# Patient Record
Sex: Female | Born: 1945 | Hispanic: Yes | Marital: Married | Smoking: Never smoker
Health system: Southern US, Community
[De-identification: ages and names within clinical notes are randomized; demographics above are authoritative.]

## PROBLEM LIST (undated history)

## (undated) DIAGNOSIS — M2550 Pain in unspecified joint: Secondary | ICD-10-CM

## (undated) DIAGNOSIS — I10 Essential (primary) hypertension: Secondary | ICD-10-CM

## (undated) DIAGNOSIS — E785 Hyperlipidemia, unspecified: Secondary | ICD-10-CM

## (undated) DIAGNOSIS — E559 Vitamin D deficiency, unspecified: Secondary | ICD-10-CM

## (undated) DIAGNOSIS — E119 Type 2 diabetes mellitus without complications: Secondary | ICD-10-CM

## (undated) DIAGNOSIS — R5383 Other fatigue: Secondary | ICD-10-CM

## (undated) HISTORY — DX: Pain in unspecified joint: M25.50

## (undated) HISTORY — DX: Hyperlipidemia, unspecified: E78.5

## (undated) HISTORY — DX: Vitamin D deficiency, unspecified: E55.9

## (undated) HISTORY — DX: Essential (primary) hypertension: I10

## (undated) HISTORY — DX: Other fatigue: R53.83

## (undated) HISTORY — DX: Type 2 diabetes mellitus without complications: E11.9

---

## 2004-02-28 ENCOUNTER — Other Ambulatory Visit: Payer: Self-pay

## 2004-04-02 ENCOUNTER — Other Ambulatory Visit: Payer: Self-pay

## 2004-10-30 ENCOUNTER — Ambulatory Visit: Payer: Self-pay | Admitting: Oncology

## 2004-12-01 ENCOUNTER — Ambulatory Visit: Payer: Self-pay | Admitting: Oncology

## 2004-12-12 ENCOUNTER — Ambulatory Visit: Payer: Self-pay | Admitting: Oncology

## 2005-03-04 ENCOUNTER — Ambulatory Visit: Payer: Self-pay | Admitting: Oncology

## 2005-03-09 ENCOUNTER — Ambulatory Visit: Payer: Self-pay | Admitting: Oncology

## 2005-03-23 ENCOUNTER — Ambulatory Visit: Payer: Self-pay | Admitting: Oncology

## 2005-07-06 ENCOUNTER — Ambulatory Visit: Payer: Self-pay | Admitting: Oncology

## 2005-08-05 ENCOUNTER — Ambulatory Visit: Payer: Self-pay | Admitting: Oncology

## 2005-08-06 ENCOUNTER — Ambulatory Visit: Payer: Self-pay | Admitting: Oncology

## 2005-10-28 ENCOUNTER — Ambulatory Visit: Payer: Self-pay | Admitting: Family Medicine

## 2005-11-23 ENCOUNTER — Ambulatory Visit: Payer: Self-pay | Admitting: Family Medicine

## 2005-12-24 ENCOUNTER — Ambulatory Visit: Payer: Self-pay | Admitting: Family Medicine

## 2006-01-21 ENCOUNTER — Ambulatory Visit: Payer: Self-pay | Admitting: Family Medicine

## 2006-02-21 ENCOUNTER — Ambulatory Visit: Payer: Self-pay | Admitting: Family Medicine

## 2006-03-11 ENCOUNTER — Ambulatory Visit: Payer: Self-pay | Admitting: Oncology

## 2006-03-18 ENCOUNTER — Ambulatory Visit: Payer: Self-pay | Admitting: Oncology

## 2006-03-23 ENCOUNTER — Ambulatory Visit: Payer: Self-pay | Admitting: Family Medicine

## 2006-03-23 ENCOUNTER — Ambulatory Visit: Payer: Self-pay | Admitting: Oncology

## 2006-09-16 ENCOUNTER — Ambulatory Visit: Payer: Self-pay | Admitting: Oncology

## 2006-10-05 ENCOUNTER — Ambulatory Visit: Payer: Self-pay

## 2007-02-22 ENCOUNTER — Ambulatory Visit: Payer: Self-pay | Admitting: Oncology

## 2007-03-18 ENCOUNTER — Ambulatory Visit: Payer: Self-pay | Admitting: Oncology

## 2007-04-07 ENCOUNTER — Ambulatory Visit: Payer: Self-pay | Admitting: Oncology

## 2007-04-24 ENCOUNTER — Ambulatory Visit: Payer: Self-pay | Admitting: Oncology

## 2007-05-01 ENCOUNTER — Emergency Department: Payer: Self-pay | Admitting: Emergency Medicine

## 2007-05-24 IMAGING — CT CT ABD-PELV W/ CM
1 of 2 series · 16 of 32 positions shown, 20 images · non-contrast
Comparison: none

REASON FOR EXAM: Uterine cancer f/u.  PR notified for interpreter
COMMENTS:

PROCEDURE:     CT  - CT ABDOMEN / PELVIS  W  - March 11, 2006  [DATE]
RESULT:
REASON FOR CONSULTATION:  Uterine cancer followup.
TECHNIQUE: Axial images were obtained from the hemidiaphragms through the
pubic symphysis post intravenous and oral administration of contrast.

[Series 2: soft tissue · axial · 0.69mm/px · z∈[+538,+946]mm · 16 of 57 slices shown, 20 images]
[im 3/57  soft-tissue]
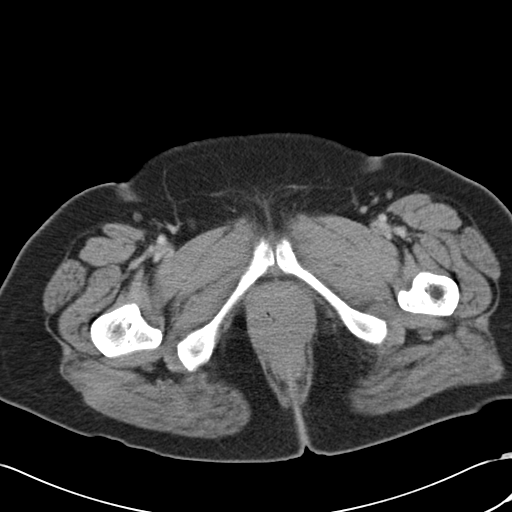
[im 3/57  bone]
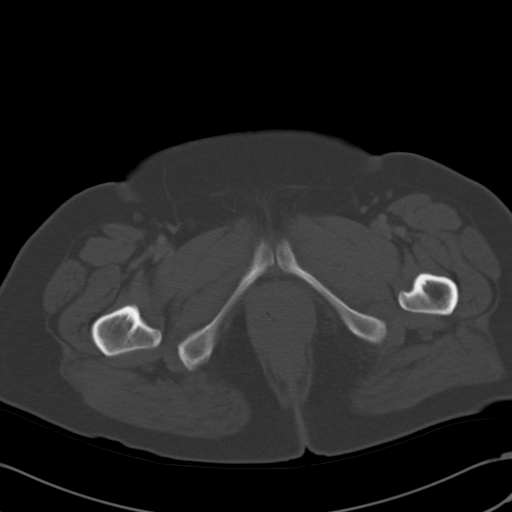
[im 7/57  soft-tissue]
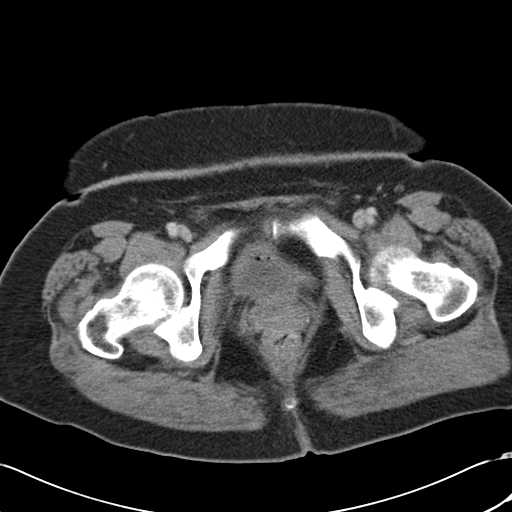
[im 12/57  soft-tissue]
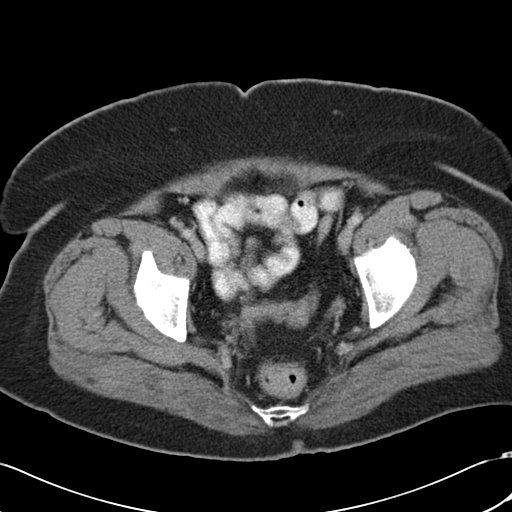
[im 16/57  soft-tissue]
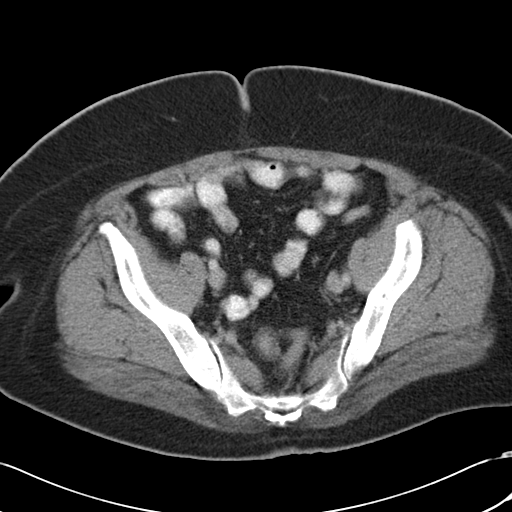
[im 18/57  soft-tissue]
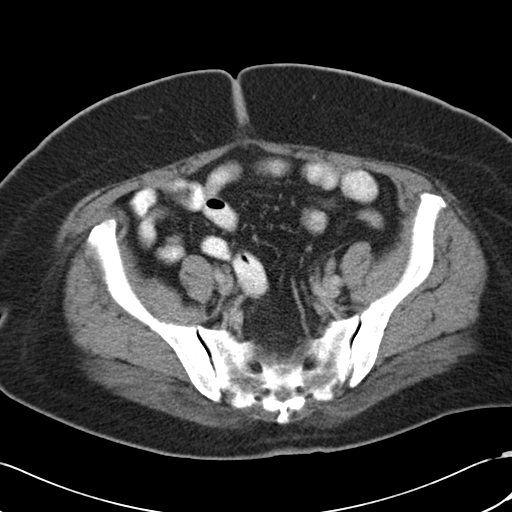
[im 23/57  soft-tissue]
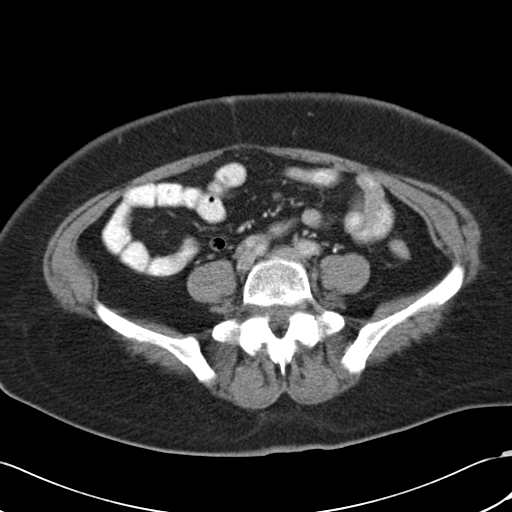
[im 27/57  soft-tissue]
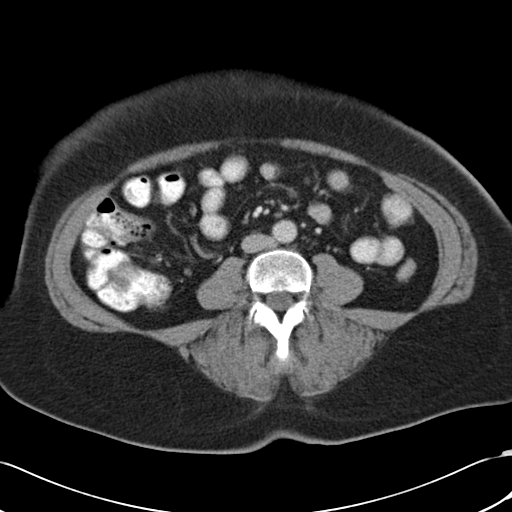
[im 30/57  soft-tissue]
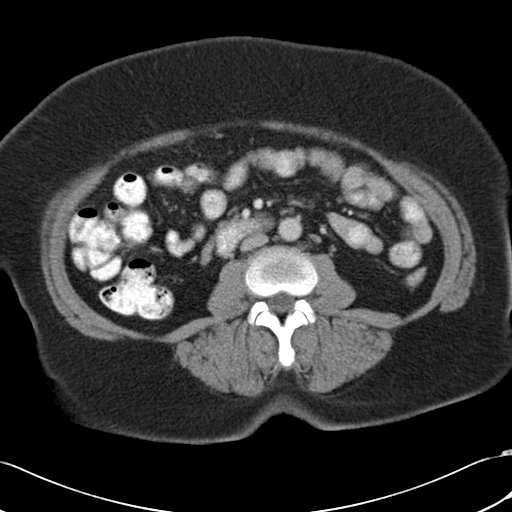
[im 34/57  soft-tissue]
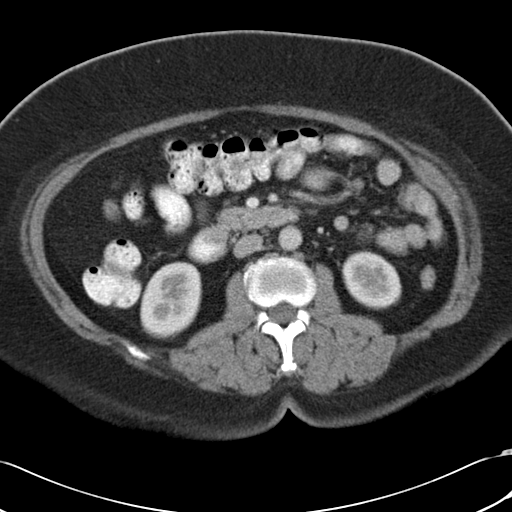
[im 34/57  bone]
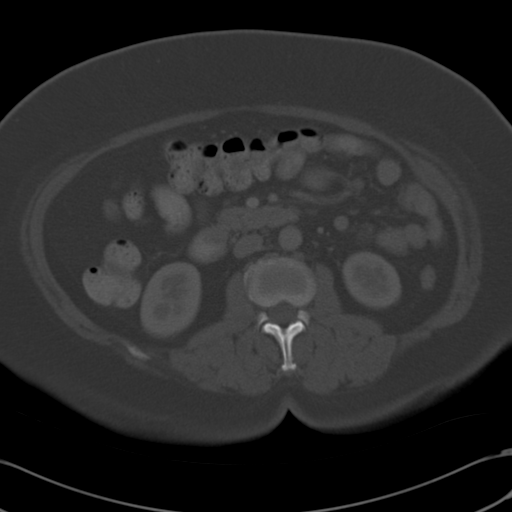
[im 39/57  soft-tissue]
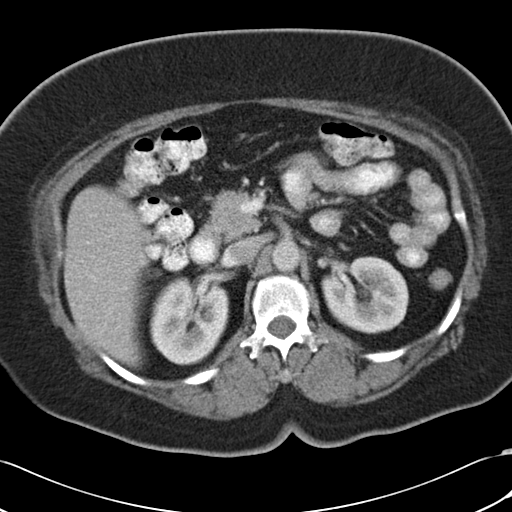
[im 43/57  soft-tissue]
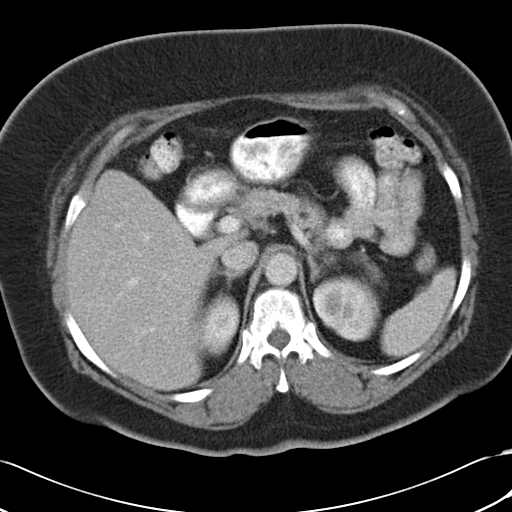
[im 45/57  soft-tissue]
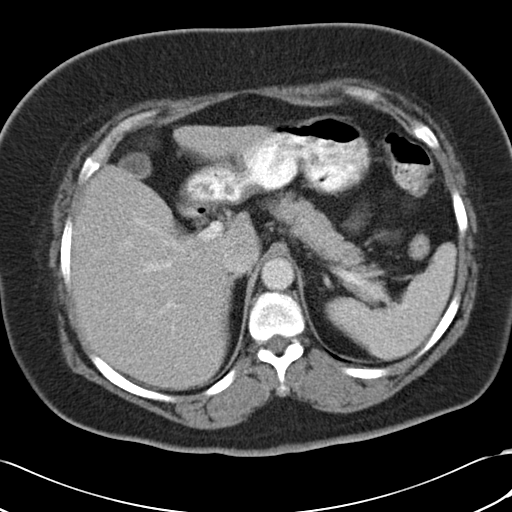
[im 48/57  lung]
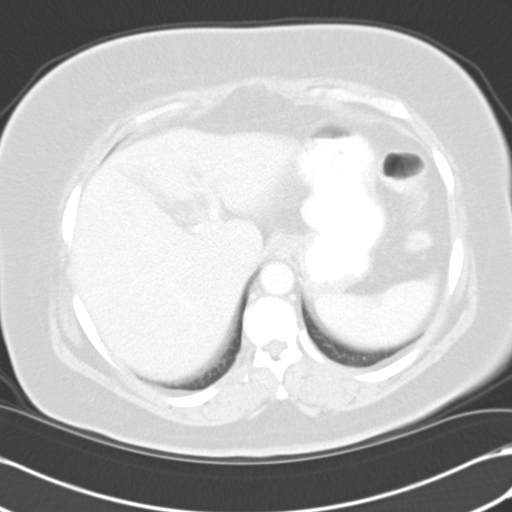
[im 50/57  soft-tissue]
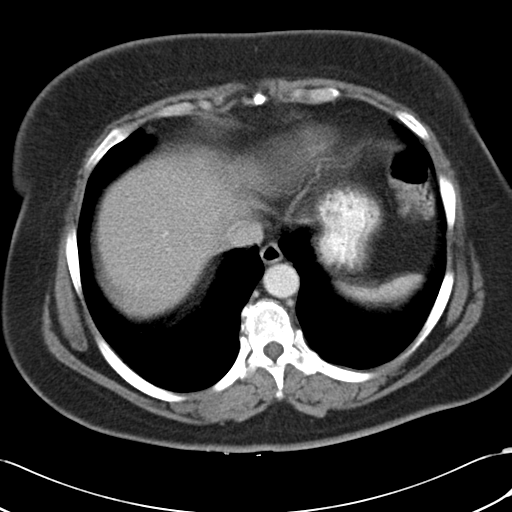
[im 50/57  lung]
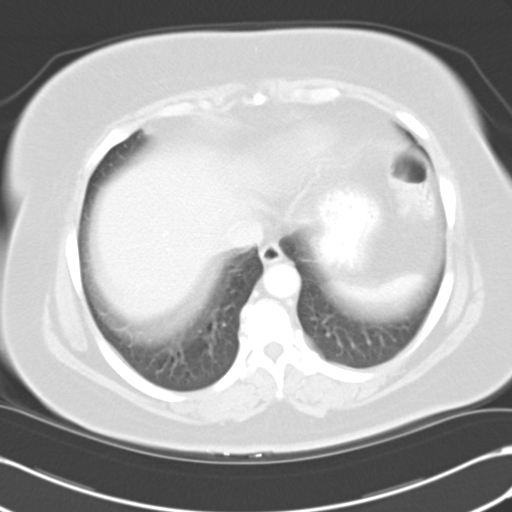
[im 52/57  lung]
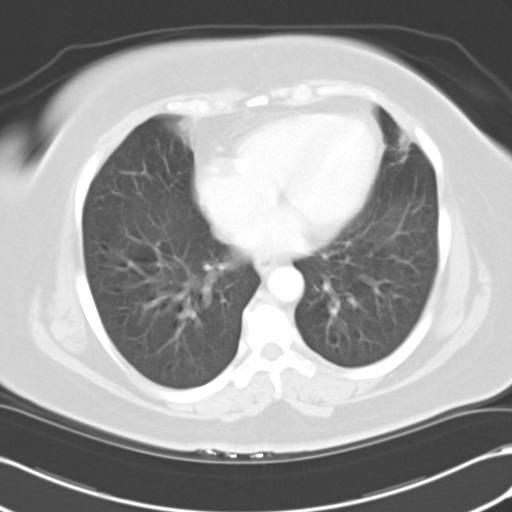
[im 54/57  soft-tissue]
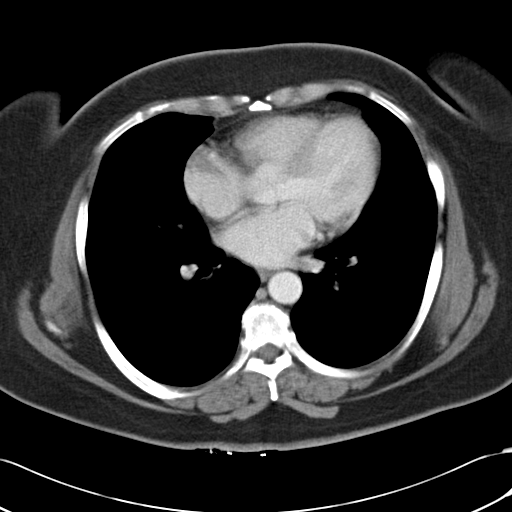
[im 54/57  lung]
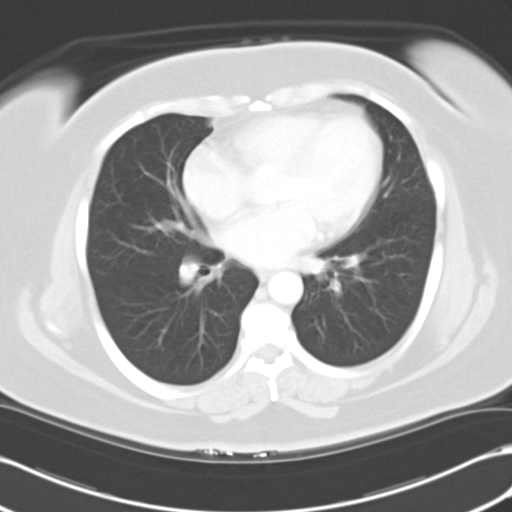

[16 of 32 positions shown; findings below may reference images not displayed]

The liver and spleen appear intact.  No focal mass is seen.  The liver and
spleen appear intact as well as the pancreas.  Both kidneys excrete the
contrast.  No hydronephrosis.  The adrenal glands appear intact.

No retroperitoneal adenopathy is identified.  No pelvic adenopathy is seen.
No fluid is noted in the pelvis or abdomen.

The images through the lung bases reveal the lung bases to be clear with no
effusions.
IMPRESSION: No evidence of metastasis is identified on the abdominal/pelvic CT.

## 2007-10-24 ENCOUNTER — Ambulatory Visit: Payer: Self-pay | Admitting: Oncology

## 2007-11-03 ENCOUNTER — Ambulatory Visit: Payer: Self-pay | Admitting: Oncology

## 2007-11-24 ENCOUNTER — Ambulatory Visit: Payer: Self-pay | Admitting: Oncology

## 2008-01-03 ENCOUNTER — Ambulatory Visit: Payer: Self-pay

## 2008-02-18 ENCOUNTER — Ambulatory Visit: Payer: Self-pay | Admitting: Family Medicine

## 2008-05-10 ENCOUNTER — Ambulatory Visit: Payer: Self-pay | Admitting: Oncology

## 2008-10-23 ENCOUNTER — Ambulatory Visit: Payer: Self-pay | Admitting: Oncology

## 2008-11-14 ENCOUNTER — Ambulatory Visit: Payer: Self-pay | Admitting: Oncology

## 2008-11-23 ENCOUNTER — Ambulatory Visit: Payer: Self-pay | Admitting: Oncology

## 2008-12-24 ENCOUNTER — Ambulatory Visit: Payer: Self-pay | Admitting: Oncology

## 2009-04-30 ENCOUNTER — Ambulatory Visit: Payer: Self-pay

## 2010-01-27 IMAGING — CR DG ABDOMEN 1V
1 series · 1 of 1 positions shown · non-contrast
Comparison: none

REASON FOR EXAM: lt flank  pain
COMMENTS:

[view not recorded]
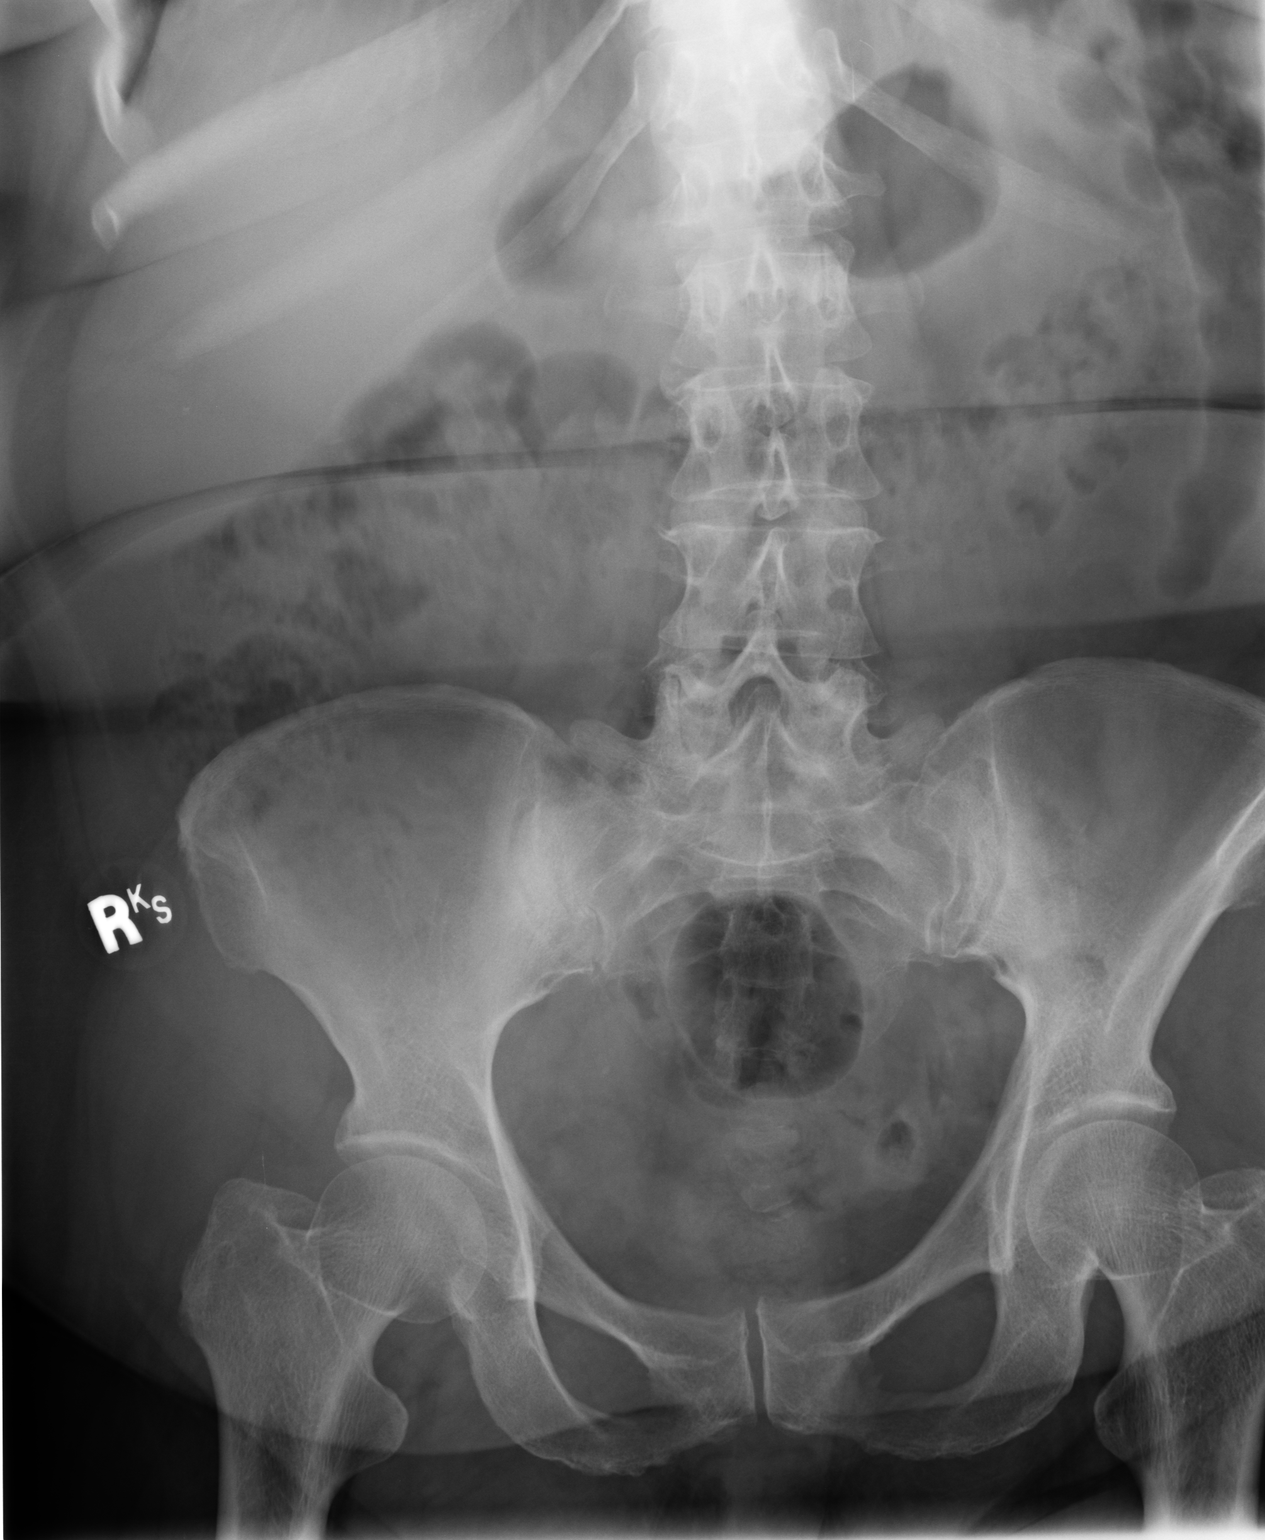

[1 of 1 positions shown; findings below may reference images not displayed]

PROCEDURE:     DXR - DXR KIDNEY URETER BLADDER  - November 14, 2008  [DATE]

RESULT:     Images of the abdomen demonstrate an unremarkable bowel gas
pattern. There is motion artifact. No definite urinary tract calculi are
evident. Degenerative changes are noted in the lower lumbar spine and
sacroiliac joints.
IMPRESSION: The bowel gas pattern is unremarkable. There are no
definite urinary tract stones evident.

## 2010-05-23 ENCOUNTER — Ambulatory Visit: Payer: Self-pay | Admitting: Oncology

## 2010-06-18 ENCOUNTER — Ambulatory Visit: Payer: Self-pay | Admitting: Oncology

## 2010-06-20 LAB — CA 125: CA 125: 14.7 U/mL (ref 0.0–34.0)

## 2010-06-23 ENCOUNTER — Ambulatory Visit: Payer: Self-pay | Admitting: Oncology

## 2010-07-29 ENCOUNTER — Ambulatory Visit: Payer: Self-pay | Admitting: Oncology

## 2010-07-29 ENCOUNTER — Ambulatory Visit: Payer: Self-pay | Admitting: Gynecologic Oncology

## 2010-09-10 ENCOUNTER — Ambulatory Visit: Payer: Self-pay

## 2011-12-08 ENCOUNTER — Ambulatory Visit: Payer: Self-pay | Admitting: Oncology

## 2011-12-11 LAB — PATHOLOGY REPORT

## 2011-12-25 ENCOUNTER — Ambulatory Visit: Payer: Self-pay | Admitting: Oncology

## 2013-08-09 ENCOUNTER — Ambulatory Visit: Payer: Self-pay | Admitting: Internal Medicine

## 2014-12-11 ENCOUNTER — Ambulatory Visit: Payer: Self-pay | Admitting: Primary Care

## 2015-05-30 ENCOUNTER — Telehealth: Payer: Self-pay | Admitting: *Deleted

## 2015-05-30 NOTE — Telephone Encounter (Signed)
Dr. Lawerance BachBurns contacted Dr. Doylene Canninghoksi today to discuss the treatment plan for pap smear follow-up and pelvic exams.  reviewed Dr. Jackson LatinoBrigitte Miller's plan of care.  In 2013,  The patient's pap smear demonstrated only minor changes. Per Dr. Rondel BatonMiller's notes, the patient's biopsies were normal and the HPV tests were negative.  Dr. Hyacinth MeekerMiller recommended no further treatment at that time in the GYN oncology clinic.  She recommended a repeat pelvic exam in 6 months by PMD or local GYN.

## 2017-08-31 ENCOUNTER — Other Ambulatory Visit: Payer: Self-pay | Admitting: Internal Medicine

## 2017-08-31 DIAGNOSIS — Z1231 Encounter for screening mammogram for malignant neoplasm of breast: Secondary | ICD-10-CM

## 2017-09-06 ENCOUNTER — Ambulatory Visit: Payer: Self-pay | Attending: Internal Medicine

## 2017-10-19 ENCOUNTER — Ambulatory Visit
Admission: RE | Admit: 2017-10-19 | Discharge: 2017-10-19 | Disposition: A | Discharge status: Home / Self Care | Payer: Self-pay | Source: Ambulatory Visit | Attending: Internal Medicine | Admitting: Internal Medicine

## 2017-10-19 DIAGNOSIS — Z1231 Encounter for screening mammogram for malignant neoplasm of breast: Secondary | ICD-10-CM

## 2023-03-03 ENCOUNTER — Other Ambulatory Visit (INDEPENDENT_AMBULATORY_CARE_PROVIDER_SITE_OTHER): Payer: Self-pay | Admitting: Nurse Practitioner

## 2023-03-03 DIAGNOSIS — I739 Peripheral vascular disease, unspecified: Secondary | ICD-10-CM

## 2023-03-12 ENCOUNTER — Encounter (INDEPENDENT_AMBULATORY_CARE_PROVIDER_SITE_OTHER): Payer: Self-pay | Admitting: Vascular Surgery

## 2023-03-12 ENCOUNTER — Encounter (INDEPENDENT_AMBULATORY_CARE_PROVIDER_SITE_OTHER): Payer: Self-pay

## 2023-04-12 ENCOUNTER — Ambulatory Visit (INDEPENDENT_AMBULATORY_CARE_PROVIDER_SITE_OTHER): Payer: Self-pay

## 2023-04-12 ENCOUNTER — Encounter (INDEPENDENT_AMBULATORY_CARE_PROVIDER_SITE_OTHER): Payer: Self-pay | Admitting: Vascular Surgery

## 2023-04-12 ENCOUNTER — Ambulatory Visit (INDEPENDENT_AMBULATORY_CARE_PROVIDER_SITE_OTHER): Payer: Self-pay | Admitting: Vascular Surgery

## 2023-04-12 VITALS — BP 115/72 | HR 80 | Resp 18 | Ht 59.0 in | Wt 137.4 lb

## 2023-04-12 DIAGNOSIS — I10 Essential (primary) hypertension: Secondary | ICD-10-CM | POA: Insufficient documentation

## 2023-04-12 DIAGNOSIS — E782 Mixed hyperlipidemia: Secondary | ICD-10-CM

## 2023-04-12 DIAGNOSIS — I739 Peripheral vascular disease, unspecified: Secondary | ICD-10-CM

## 2023-04-12 DIAGNOSIS — E785 Hyperlipidemia, unspecified: Secondary | ICD-10-CM | POA: Insufficient documentation

## 2023-04-12 DIAGNOSIS — E119 Type 2 diabetes mellitus without complications: Secondary | ICD-10-CM | POA: Insufficient documentation

## 2023-04-12 DIAGNOSIS — M25561 Pain in right knee: Secondary | ICD-10-CM

## 2023-04-12 DIAGNOSIS — M25569 Pain in unspecified knee: Secondary | ICD-10-CM | POA: Insufficient documentation

## 2023-04-12 NOTE — Progress Notes (Signed)
MRN : [id number]  Sandra Gillespie is a 77 y.o. ([birth year]) female who presents with chief complaint of check circulation.  History of Present Illness:   The patient is seen for evaluation of painful lower extremities, predominantly at the right compared to the left.  She notes that the pain is centered at the knee level.  Patient notes the pain is variable and not always associated with activity.  The pain is somewhat consistent day to day occurring on most days. The patient notes the pain also occurs with standing as well as with walking. The pain has been progressive over the past year or so.  It is gotten to the point where she feels her knee gives out and she is actually fallen recently and sprained her right ankle.  The patient states these symptoms are causing  a negative impact on quality of life and daily activities which was a factor in the referral.  (Of note she is also complaining of pain in her right wrist as well as her right shoulder)  The patient denies a  history of back problems and DJD of the lumbar and sacral spine.   The patient denies rest pain or dangling of an extremity off the side of the bed during the night for relief. No open wounds or sores at this time. No history of DVT or phlebitis. No prior vascular interventions or surgeries.   ABIs obtained today are normal bilaterally with triphasic signals at all 4 tibial vessels and normal toe tracings as well  Current Meds  Medication Sig   acetaminophen (TYLENOL) 500 MG tablet Take 500 mg by mouth as needed for mild pain.   b complex vitamins capsule Take 1 capsule by mouth daily.   cetirizine (ZYRTEC) 10 MG tablet Take 10 mg by mouth daily.   dicyclomine (BENTYL) 20 MG tablet Take 20 mg by mouth every 6 (six) hours.   DULoxetine (CYMBALTA) 30 MG capsule Take 30 mg by mouth daily.   fluticasone (FLONASE) 50 MCG/ACT nasal spray Place 2 sprays into  both nostrils daily.   GNP OLOPATADINE HCL 0.2 % SOLN Apply to eye.   JANUMET 50-1000 MG tablet Take 1 tablet by mouth 2 (two) times daily.   JARDIANCE 25 MG TABS tablet Take 25 mg by mouth daily.   lidocaine (LIDODERM) 5 % Place 1 patch onto the skin daily.   linagliptin (TRADJENTA) 5 MG TABS tablet Take 1 tablet by mouth daily.   lisinopril (ZESTRIL) 10 MG tablet Take 10 mg by mouth daily.   metFORMIN (GLUCOPHAGE-XR) 500 MG 24 hr tablet Take 500 mg by mouth daily with breakfast.   nystatin cream (MYCOSTATIN) Apply 1 Application topically 2 (two) times daily.   ondansetron (ZOFRAN) 4 MG tablet Take 4 mg by mouth every 8 (eight) hours as needed for nausea or vomiting.   salicylic acid 6 % gel Apply 1 Application topically daily.   simethicone (MYLICON) 80 MG chewable tablet Chew 80 mg by mouth every 6 (six) hours as needed for flatulence.   simvastatin (ZOCOR) 20 MG tablet Take 20 mg by mouth  daily at 6 PM.   sucralfate (CARAFATE) 1 g tablet Take 1 g by mouth 4 (four) times daily -  with meals and at bedtime.   valACYclovir (VALTREX) 500 MG tablet Take 1 mg by mouth 3 (three) times daily.   VOLTAREN 1 % GEL Apply 2 g topically 4 (four) times daily.    No past medical history on file.  No past surgical history on file.  Social History Social History   Tobacco Use   Smoking status: Never   Smokeless tobacco: Never  Substance Use Topics   Alcohol use: Never   Drug use: Never    Family History Family History  Problem Relation Age of Onset   Breast cancer Sister 71    Allergies  Allergen Reactions   Cyclobenzaprine     Other reaction(s): severe sleepiness x 2 days   Gabapentin Other (See Comments)     REVIEW OF SYSTEMS (Negative unless checked)  Constitutional: [] Weight loss  [] Fever  [] Chills Cardiac: [] Chest pain   [] Chest pressure   [] Palpitations   [] Shortness of breath when laying flat   [] Shortness of breath with exertion. Vascular:  [x] Pain in legs with walking    [] Pain in legs at rest  [] History of DVT   [] Phlebitis   [] Swelling in legs   [] Varicose veins   [] Non-healing ulcers Pulmonary:   [] Uses home oxygen   [] Productive cough   [] Hemoptysis   [] Wheeze  [] COPD   [] Asthma Neurologic:  [] Dizziness   [] Seizures   [] History of stroke   [] History of TIA  [] Aphasia   [] Vissual changes   [] Weakness or numbness in arm   [] Weakness or numbness in leg Musculoskeletal:   [] Joint swelling   [x] Joint pain   [] Low back pain Hematologic:  [] Easy bruising  [] Easy bleeding   [] Hypercoagulable state   [] Anemic Gastrointestinal:  [] Diarrhea   [] Vomiting  [] Gastroesophageal reflux/heartburn   [] Difficulty swallowing. Genitourinary:  [] Chronic kidney disease   [] Difficult urination  [] Frequent urination   [] Blood in urine Skin:  [] Rashes   [] Ulcers  Psychological:  [] History of anxiety   []  History of major depression.  Physical Examination  Vitals:   04/12/23 1447  BP: 115/72  Pulse: 80  Resp: 18  Weight: 137 lb 6.4 oz (62.3 kg)  Height: 4\' 11"  (1.499 m)   Body mass index is 27.75 kg/m. Gen: WD/WN, NAD Head: [place]/AT, No temporalis wasting.  Ear/Nose/Throat: Hearing grossly intact, nares w/o erythema or drainage Eyes: PER, EOMI, sclera nonicteric.  Neck: Supple, no masses.  No bruit or JVD.  Pulmonary:  Good air movement, no audible wheezing, no use of accessory muscles.  Cardiac: RRR, normal S1, S2, no Murmurs. Vascular:   Vessel Right Left  Radial Palpable Palpable  PT Palpable Palpable  DP Palpable Palpable  Gastrointestinal: soft, non-distended. No guarding/no peritoneal signs.  Musculoskeletal: M/S 5/5 throughout.  No visible deformity.  Neurologic: CN 2-12 intact. Pain and light touch intact in extremities.  Symmetrical.  Speech is fluent. Motor exam as listed above. Psychiatric: Judgment intact, Mood & affect appropriate for pt's clinical situation. Dermatologic: No rashes or ulcers noted.  No changes consistent with cellulitis.   CBC No results  found for: "WBC", "HGB", "HCT", "MCV", "PLT"  BMET No results found for: "NA", "K", "CL", "CO2", "GLUCOSE", "BUN", "CREATININE", "CALCIUM", "GFRNONAA", "GFRAA" CrCl cannot be calculated (No successful lab value found.).  COAG No results found for: "INR", "PROTIME"  Radiology No results found.   Assessment/Plan 1. Arthralgia of right lower leg  Recommend:  The patient has atypical pain symptoms for vascular disease and on exam I do not find evidence of vascular pathology that would explain the patient's symptoms.  Noninvasive studies do not identify significant vascular problems  I suspect the patient is c/o pain of musculoskeletal origin most likely degenerative joint disease of the knee.  Patient should have an evaluation of the right knee which I defer to the orthopedic service (at that time her wrist and shoulder can also be evaluated).  The patient should continue walking and begin a more formal exercise program. The patient should continue his antiplatelet therapy and aggressive treatment of the lipid abnormalities.  Patient will follow-up with me on a PRN basis. - Ambulatory referral to Orthopedics  2. Essential hypertension Continue antihypertensive medications as already ordered, these medications have been reviewed and there are no changes at this time.  3. Mixed hyperlipidemia Continue statin as ordered and reviewed, no changes at this time  4. Type 2 diabetes mellitus without complication, without long-term current use of insulin (HCC) Continue hypoglycemic medications as already ordered, these medications have been reviewed and there are no changes at this time.  Hgb A1C to be monitored as already arranged by primary service    Levora Dredge, MD  04/12/2023 2:59 PM

## 2024-07-03 NOTE — ED Notes (Signed)
 ED Attending Attestation  I supervised care provided by the resident, Dr. Wilfred. We have discussed the case, I have reviewed the note and I agree with the plan of treatment. Pertinent labs & imaging results which were available during my care of the patient were reviewed by me. See chart and resident documentation for details.  Final diagnoses:  Atrial fib/flutter, transient    (Primary)    Lovett KATHEE Bio, MD  July 03, 2024 9:19 PM

## 2024-07-03 NOTE — ED Provider Notes (Signed)
 [facility] Emergency Department Provider Note   ED Course, Assessment, and Plan   In summary 78 year old female with a past medical history of hypertension, type 2 diabetes, uterine cancer now in remission.  Please see below for story-however ultimately her daughter produces paperwork which indicates Friday on 8/8 she was seen in clinic and noted to be in atrial fibrillation.  Given this I recommend she present for evaluation.  She presents 4 days later, noting that her symptoms seem to have improved.  Please see below for history-suspect she likely had a viral URI last week.  No signs of postviral pneumonia on x-ray physical exam and reassuring oxygenation.  Here she is not in atrial fibrillation, she is rate controlled and in normal sinus rhythm here without any medication.  Suspect likely new onset A-fib.  Of note did consider for example because of her A-fib-however her TSH was essentially normal with a free T4 that reflects normal, no marked electrolyte normality, troponin reassuring especially given nearly a week of symptoms, and as noted above no sign overtly for example pneumonia or postviral pneumonia.  No new murmur noted.  EKG showed normal sinus rhythm.  Suspect she was likely briefly in A-fib at that time-of note while at 1707 here she was tachycardia to 114 her EKGs do not show atrial fibrillation and since then she has been in normal sinus rhythm here.  Given this, was given an expedited referral to atrial fibrillation clinic.  Of note I did discuss her elevated CHA2DS2-VASc with her, as well as the need for anticoagulation given the elevated risk factor for stroke as well as possible thrombus.  Discussed this with her that the risk would be that she could have increased bleeding for example from a fall car accident, however that this within the blood and then prevent occult thrombus prevent stroke.  After discussing it further, she elected to wait, and discuss with the atrial  fibrillation provider in the upcoming appointment.  Did not prescribe any medication for rate control given that here she is normal sinus rhythm.  Patient was discharged in stable condition with the above plan, atrial fibrillation clinic appointment supplied, strict return precautions were also advised.  ED Course as of 07/03/24 2358  Mon Jul 03, 2024  2105 Magnesium: 1.9  2249 Yes this essentially normal.  Will plan for outpatient follow-up    _____________________________________________________________________  The case was discussed with the attending physician who is in agreement with the above assessment and plan.  Additional Medical Decision Making   - Any discussion of this patient's case/presentation between myself and consultants, admitting teams, or other team members has been documented above. - Imaging and other studies, if performed, that were available during my care of the patient were independently reviewed and interpreted by me and considered in my medical decision making as documented above.  History   Chief Complaint:  Chief Complaint  Patient presents with  . Evaluation of Abnormal EKG    History of Present Illness:  Sandra Gillespie is a 78 y.o. female with a past medical history of HTN, T2DM, Bell's palsy (R sided), uterine cancer in remission, and arthritis who presents, accompanied by her daughter, for evaluation of an abnormal lab. The patient reports that 1 week ago, she spent a night sleeping in a car in wet clothes, after which she developed a chest cold with subjective fevers and cough. She then states she developed pain in her throat, which then progressed to lower chest pain  and some associated difficulty breathing. She was seen at [facility] on Friday (08/08), where she tells us  they took a listen to her lungs or her heart and said they sound something abnormal.  At that time they recommended she come to emergency department however she  wanted to wait until to come to feel better so did not present today.  She reports at this time her chest pain is improved and her discomfort is improved.    The transfer of care summary from this visit (see media) describes her pain as epigastric, with 1 episode of non-bloody emesis and associated generalized fatigue in the prior week. No history of heart attacks. She states that she is not on antihypertensives or cholesterol medications.   After the above discussion, she pulls out paperwork.  Please see attached media tab-it appears that she was in atrial fibrillation which is new for her and recommended come here to the emergency department as she had headache at that time.  Past Medical History: Past Medical History[1]  Allergies:  Cyclobenzaprine hcl and Gabapentin  Past Surgical History:  Past Surgical History[2]  Social History:  Social History   Tobacco Use  . Smoking status: Never  . Smokeless tobacco: Never  Substance Use Topics  . Alcohol use: No    Family History: Family History[3]   Physical Exam   Vital Signs:   BP 160/61   Pulse 83   Temp 36.7 C (98.1 F) (Oral)   Resp 18   SpO2 99%   General: Awake, in no distress. Skin: Warm, dry. Heart: Regular rhythm, normal heart sounds.  No atrial fibrillation noted.  No murmurs noted early.  2+ radial pulses bilaterally. Lungs: Normal work of breathing. Lungs clear to auscultation.  No coarse or fine crackles. Abdomen: Soft, flat, no overt tenderness noted. Musculoskeletal: No deformities or tenderness. Chest wall is non-tender. Neurological: No gross focal neurologic deficits are appreciated. Psychiatric: Normal affect and behavior for situation.  _____________________________________________________________________  Please note - This documentation was generated using dictation and/or voice recognition software, and as such, may contain spelling or other transcription errors. Any questions regarding the  content of this documentation should be directed to the individual who electronically signed.  Documentation assistance was provided by Brad Cunning, Scribe on July 03, 2024 at 6:06 PM for Gustav Jubilee, MD.  Documentation assistance was provided by the scribe in my presence.  The documentation recorded by the scribe has been reviewed by me and accurately reflects the services I personally performed.       [1] Past Medical History: Diagnosis Date  . Allergic rhinitis   . Anisocoria   . Arthritis    right shoulder/right foot  . Cancer       uterine - sex cord stromal tumor; gastric carcionid  . Cataract    Mild, not visually significant will monitor  . Diabetes mellitus    Dx 2009   Type II  . Dry eyes    Uses ATs when needed  . Facial paralysis   . Facial paralysis    left eye  . HL (hearing loss)   . Hypertension   . Tinnitus   . Weight loss   [2] Past Surgical History: Procedure Laterality Date  . HYSTERECTOMY    . OOPHORECTOMY Bilateral   . PR COLONOSCOPY FLX DX W/COLLJ SPEC WHEN PFRMD N/A 06/08/2023   Procedure: COLONOSCOPY, FLEXIBLE, PROXIMAL TO SPLENIC FLEXURE; DIAGNOSTIC, W/WO COLLECTION SPECIMEN BY BRUSH OR WASH;  Surgeon: Gwenyth Barter  Lynwood, MD;  [facility];  Service: Gastroenterology  . PR EDG ABLATE TUMOR POLYP/LESION W/DILATION& WIRE  06/14/2024   Procedure: EGD, FLEXIBLE, TRANSORAL; WITH ABLATION OF TUMOR(S), POLYP(S), OR OTHER LESION(S) (INCLUDEDS PRE-AND POST-DILATION AND GUIDE WIRE PASSAGE, WHEN PERFORMED);  Surgeon: Gwenyth Prentice Lynwood, MD;  Location: [facility];  Service: Gastroenterology  . PR EDG TRANSORAL ENDOSCOPIC MUCOSAL RESECTION N/A 06/14/2024   Procedure: ESOPHAGOGASTRODUODENOSCOPY, FLEXIBLE, TRANSORAL; WITH ENDOSCOPIC MUCOSAL RESECTION;  Surgeon: Gwenyth Prentice Lynwood, MD;  [facility];  Service: Gastroenterology  . PR UPPER GI ENDOSCOPY,BIOPSY N/A 07/05/2014   Procedure: UGI  ENDOSCOPY; WITH BIOPSY, SINGLE OR MULTIPLE;  Surgeon: Sherwood CHRISTELLA Emmer, MD;  Location: [facility];  Service: Gastroenterology  . PR UPPER GI ENDOSCOPY,BIOPSY N/A 12/18/2015   Procedure: UGI ENDOSCOPY; WITH BIOPSY, SINGLE OR MULTIPLE;  Surgeon: Olam Earnie Henle, MD;  Location: [facility];  Service: Gastroenterology  . PR UPPER GI ENDOSCOPY,BIOPSY N/A 09/13/2017   Procedure: UGI ENDOSCOPY; WITH BIOPSY, SINGLE OR MULTIPLE;  Surgeon: Toribio Beverley Grumbling, MD;  Location: [facility];  Service: Gastroenterology  . PR UPPER GI ENDOSCOPY,BIOPSY N/A 01/16/2019   Procedure: UGI ENDOSCOPY; WITH BIOPSY, SINGLE OR MULTIPLE;  Surgeon: Toribio Beverley Grumbling, MD;  Location: [facility];  Service: Gastroenterology  . PR UPPER GI ENDOSCOPY,BIOPSY N/A 06/06/2020   Procedure: UGI ENDOSCOPY; WITH BIOPSY, SINGLE OR MULTIPLE;  Surgeon: Derick Bring, MD;  [facility];  Service: Gastrointestinal  . PR UPPER GI ENDOSCOPY,BIOPSY N/A 06/08/2023   Procedure: UGI ENDOSCOPY; WITH BIOPSY, SINGLE OR MULTIPLE;  Surgeon: Gwenyth Prentice Lynwood, MD;  Location: [facility];  Service: Gastroenterology  . PR UPPER GI ENDOSCOPY,CTRL BLEED N/A 06/14/2024   Procedure: UGI ENDOSCOPY; WITH CONTROL OF BLEEDING, ANY METHOD;  Surgeon: Gwenyth Prentice Lynwood, MD;  Location: [facility];  Service: Gastroenterology  . TUMOR REMOVAL  Stomach   per patient via interpreter  [3] Family History Problem Relation Age of Onset  . Stroke Mother   . Cataracts Father   . No Known Problems Sister   . Diabetes Brother   . No Known Problems Maternal Grandmother   . No Known Problems Maternal Grandfather   . No Known Problems Paternal Grandmother   . No Known Problems Paternal Grandfather   . No Known Problems Maternal Aunt   . No Known Problems Maternal Uncle   . No Known Problems Paternal Aunt   . No Known Problems Paternal Uncle   . No  Known Problems Other   . Glaucoma Neg Hx   . Amblyopia Neg Hx   . Blindness Neg Hx   . Cancer Neg Hx   . Hypertension Neg Hx   . Macular degeneration Neg Hx   . Retinal detachment Neg Hx   . Strabismus Neg Hx   . Thyroid disease Neg Hx   . Breast cancer Neg Hx    Wilfred Gustav Bien, MD Resident 07/04/24 [phone]

## 2024-10-15 NOTE — Progress Notes (Deleted)
  Cardiology Office Note   Date:  10/15/2024  ID:  [name], [name] Jul 16, 1946, MRN [id number] PCP: Geofm Nancie SQUIBB, MD  [facility] Providers Cardiologist:  None { Click to update primary MD,subspecialty MD or APP then REFRESH:1}    History of Present Illness Sandra Gillespie is a 78 y.o. female PMH DM 2, HLD who presents for further evaluation and management of atrial fibrillation.  Patient recently seen by PCP for this issue on 07/06/2024.  She was reportedly in new atrial fibrillation but refused to go to the ED.  Heart rate documented at the office visit was 110 bpm.  BP 104/71.  Relevant CVD History -None   ROS: Pt denies any chest discomfort, jaw pain, arm pain, palpitations, syncope, presyncope, orthopnea, PND, or LE edema.  Studies Reviewed I have independently reviewed the patient's ECG, previous medical records.  Physical Exam VS:  There were no vitals taken for this visit.       Wt Readings from Last 3 Encounters:  04/12/23 137 lb 6.4 oz (62.3 kg)    GEN: No acute distress. NECK: No JVD; No carotid bruits. CARDIAC: ***RRR, no murmurs, rubs, gallops. RESPIRATORY:  Clear to auscultation. EXTREMITIES:  Warm and well-perfused. No edema.  ASSESSMENT AND PLAN Atrial fibrillation HLD        {Are you ordering a CV Procedure (e.g. stress test, cath, DCCV, TEE, etc)?   Press F2        :[id number]}  Dispo: ***  Signed, Caron Poser, MD

## 2024-10-17 ENCOUNTER — Ambulatory Visit: Payer: Self-pay

## 2024-10-18 ENCOUNTER — Ambulatory Visit

## 2024-10-18 VITALS — BP 140/80 | HR 76 | Ht 60.0 in | Wt 136.0 lb

## 2024-10-18 DIAGNOSIS — E782 Mixed hyperlipidemia: Secondary | ICD-10-CM

## 2024-10-18 DIAGNOSIS — I480 Paroxysmal atrial fibrillation: Secondary | ICD-10-CM

## 2024-10-18 DIAGNOSIS — Z7901 Long term (current) use of anticoagulants: Secondary | ICD-10-CM

## 2024-10-18 DIAGNOSIS — Z5181 Encounter for therapeutic drug level monitoring: Secondary | ICD-10-CM | POA: Diagnosis not present

## 2024-10-18 MED ORDER — APIXABAN 5 MG PO TABS: 5.0000 mg | ORAL_TABLET | Freq: Two times a day (BID) | ORAL | 3 refills | Status: AC

## 2024-10-18 NOTE — Progress Notes (Signed)
  Cardiology Office Note   Date:  10/18/2024  ID:  Sandra Gillespie, Sandra Gillespie 1946-05-08, MRN [id number] PCP: Geofm Nancie SQUIBB, MD  [facility] Providers Cardiologist:  Caron Poser, MD     History of Present Illness Sandra Gillespie is a 78 y.o. female PMH DM 2, HLD who presents for further evaluation and management of atrial fibrillation.  Patient recently seen by PCP for this issue on 07/06/2024.  She was reportedly in new atrial fibrillation but refused to go to the ED.  Heart rate documented at the office visit was 110 bpm.  BP 104/71.  Recent TSH was low but a T4 was normal.  Patient reports she is overall feeling well.  She states that she has intermittent palpitations which are self-limited.  No syncope.  No signs or symptoms of heart failure.  No other issues today.  We had a long risk counseling discussion regarding the natural history of atrial fibrillation as well as the accompanying stroke risk.  We also discussed anticoagulation and bleeding risk.  Overall, I discussed that the risk-benefit ratio favors starting anticoagulation given her profile.  Relevant CVD History -None   ROS: Pt denies any chest discomfort, jaw pain, arm pain, palpitations, syncope, presyncope, orthopnea, PND, or LE edema.  Studies Reviewed I have independently reviewed the patient's ECG, previous medical records.  Physical Exam VS:  BP (!) 140/80 (BP Location: Left Arm, Patient Position: Sitting, Cuff Size: Normal)   Pulse 76   Ht 5' (1.524 m)   Wt 136 lb (61.7 kg)   SpO2 98%   BMI 26.56 kg/m        Wt Readings from Last 3 Encounters:  10/18/24 136 lb (61.7 kg)  04/12/23 137 lb 6.4 oz (62.3 kg)    GEN: No acute distress. NECK: No JVD; No carotid bruits. CARDIAC: RRR, no murmurs, rubs, gallops. RESPIRATORY:  Clear to auscultation. EXTREMITIES:  Warm and well-perfused. No edema.  ASSESSMENT AND PLAN Atrial fibrillation Cardiac risk counseling Anticoagulation management  encounter Paroxysmal. In sinus rhythm today.  CHA2DS2-VASc of at least 4.  Infrequent symptoms.  No signs or symptoms of heart failure.  Had a long discussion today regarding the natural history of atrial fibrillation as well as the accompanying stroke risk.  We discussed starting anticoagulation which she is amenable.  We discussed risks of bleeding with anticoagulation.  Given her age and risk profile, risk-benefit ratio favors starting anticoagulation.  Plan: - Start Eliquis  5 mg twice daily; advised her to watch for any ongoing bleeding issues - Zio monitor to assess A-fib burden - Echocardiogram        Dispo: RTC 1 year or sooner as needed  Signed, Caron Poser, MD

## 2024-10-18 NOTE — Patient Instructions (Addendum)
 Medication Instructions:  Your physician recommends the following medication changes.  START TAKING: Eliquis  5mg  by mouth twice daily  Continue all other medications as prescribed. *If you need a refill on your cardiac medications before your next appointment, please call your pharmacy*  Instrucciones de medicacin: Su mdico recomienda los siguientes cambios en la medicacin.  COMIENCE A TOMAR:  Eliquis  5 mg por va oral dos veces al da  Contine tomando todos los dems medicamentos segn lo prescrito. *Si necesita resurtir sus medicamentos para el corazn antes de su prxima cita, llame a su farmacia*   Lab Work: No labs ordered today   Anlisis de laboratorio: No se han solicitado anlisis hoy.   Testing/Procedures:  Your physician has requested that you have an echocardiogram. Echocardiography is a painless test that uses sound waves to create images of your heart. It provides your doctor with information about the size and shape of your heart and how well your heart's chambers and valves are working.   You may receive an ultrasound enhancing agent through an IV if needed to better visualize your heart during the echo. This procedure takes approximately one hour.  There are no restrictions for this procedure.  This will take place at [address] [facility] [address], [place] [address]  Please note: We ask at that you not bring children with you during ultrasound (echo/ vascular) testing. Due to room size and safety concerns, children are not allowed in the ultrasound rooms during exams. Our front office staff cannot provide observation of children in our lobby area while testing is being conducted. An adult accompanying a patient to their appointment will only be allowed in the ultrasound room at the discretion of the ultrasound technician under special circumstances. We apologize for any inconvenience.    Su mdico le ha solicitado science writer. La  ecocardiografa es una prueba indolora que utiliza ondas sonoras para crear imgenes del corazn. Proporciona a su mdico informacin sobre el tamao y la forma de su corazn, as como sobre el funcionamiento de sus cmaras y vlvulas.   Si es necesario, podra recibir un agente potenciador de la ecografa por va intravenosa para visualizar mejor su corazn durante la ecografa.  Este procedimiento dura aproximadamente una hora.  No hay restricciones para este procedimiento.  Se realizar en [address], [place] [address].  Nota: Le pedimos que no traiga nios durante la ecografa (eco/vascular). Debido al tamao de la sala y a cuestiones de seguridad, no se permite el ingreso de nios a las salas de toll brothers. Nuestro personal de recepcin no puede observar a los nios en el vestbulo mientras se realizan las pruebas. Solo se permitir el ingreso de un adulto que acompae a un paciente a su cita a discrecin del tcnico de ecografa en circunstancias especiales. Nos disculpamos por cualquier inconveniente.  ZIO XT- Long Term Monitor Instructions  (see Handout)  Instrucciones del monitor de largo plazo ZIO XT (ver folleto)  Your physician has requested you wear a ZIO patch monitor for 14 days.  This is a single patch monitor. [employer] supplies one patch monitor per enrollment. Additional stickers are not available. Please do not apply patch if you will be having a Nuclear Stress Test, Echocardiogram, Cardiac CT, MRI, or Chest Xray during the period you would be wearing the monitor. The patch cannot be worn during these tests. You cannot remove and re-apply the ZIO XT patch monitor.  Your ZIO  patch monitor will be mailed 3 day USPS to your address on file. It may take 3-5 days to receive your monitor after you have been enrolled. Once you have received your monitor, please review the enclosed instructions. Your monitor has already been  registered assigning a specific monitor serial number to you.  Billing and Patient Assistance Program Information  We have supplied [employer] with any of your insurance information on file for billing purposes.  [employer] offers a sliding scale Patient Assistance Program for patients that do not have insurance, or whose insurance does not completely cover the cost of the ZIO monitor.  You must apply for the Patient Assistance Program to qualify for this discounted rate.  To apply, please call [employer] at [phone], select option 4, select option 2, ask to apply for Patient Assistance Program. Meredeth will ask your household income, and how many people are in your household. They will quote your out-of-pocket cost based on that information. [employer] will also be able to set up a 68-month, interest-free payment plan if needed.  Applying the monitor   Shave hair from upper left chest.  Hold abrader disc by orange tab. Rub abrader in 40 strokes over the upper left chest as indicated in your monitor instructions.  Clean area with 4 enclosed alcohol pads. Let dry.  Apply patch as indicated in monitor instructions. Patch will be placed under collarbone on left side of chest with arrow pointing upward.  Rub patch adhesive wings for 2 minutes. Remove white label marked 1. Remove the white label marked 2. Rub patch adhesive wings for 2 additional minutes.  While looking in a mirror, press and release button in center of patch. A small green light will flash 3-4 times. This will be your only indicator that the monitor has been turned on.   After Applying Monitor: Do not shower for the first 24 hours. You may shower after the first 24 hours. 1 Press the button if you feel a symptom. You will hear a small click. Record Date, Time and Symptom in the Patient Logbook.   After Completing 14 Days: When you are ready to remove the patch, follow instructions on the last 2 pages of Patient Logbook.  Stick patch  monitor into the tabs at the bottom of the return box.  Place Patient Logbook in the blue and white box. Use locking tab on box and tape box closed securely. The blue and white box has prepaid postage on it. Please place it in the mailbox as soon as possible. Your physician should have your test results approximately 7-14 days after the monitor has been mailed back to [facility].   Troubleshooting: Call [facility] at [phone] if you have questions regarding your ZIO XT patch monitor.  Call them immediately if you see an orange light blinking on your monitor.  If your monitor falls off in less than 4 days, contact our Monitor department at [phone].  If your monitor becomes loose or falls off after 4 days call [employer] at [phone] for suggestions on securing your monitor.   Follow-Up: At [facility], you and your health needs are our priority.  As part of our continuing mission to provide you with exceptional heart care, our providers are all part of one team.  This team includes your primary Cardiologist (physician) and Advanced Practice Providers or APPs (Physician Assistants and Nurse Practitioners) who all work together to provide you with the care you need, when you need it.  Your  next appointment:   12 month(s)  Provider:   Caron Poser, MD    We recommend signing up for the patient portal called MyChart.  Sign up information is provided on this After Visit Summary.  MyChart is used to connect with patients for Virtual Visits (Telemedicine).  Patients are able to view lab/test results, encounter notes, upcoming appointments, etc.  Non-urgent messages can be sent to your provider as well.   To learn more about what you can do with MyChart, go to [email or web address].

## 2024-10-24 ENCOUNTER — Ambulatory Visit

## 2024-10-24 DIAGNOSIS — I480 Paroxysmal atrial fibrillation: Secondary | ICD-10-CM | POA: Diagnosis not present

## 2024-10-24 LAB — ECHOCARDIOGRAM COMPLETE
AR max vel: 1.96 cm2
AV Area VTI: 1.87 cm2
AV Area mean vel: 1.91 cm2
AV Mean grad: 5 mmHg
AV Peak grad: 8.8 mmHg
Ao pk vel: 1.48 m/s
Area-P 1/2: 2.87 cm2
MV M vel: 2.24 m/s
MV Peak grad: 20.1 mmHg
P 1/2 time: 669 ms
S' Lateral: 2.5 cm

## 2024-10-24 MED ORDER — PERFLUTREN LIPID MICROSPHERE
1.0000 mL | INTRAVENOUS | Status: AC | PRN
  Administered 2024-10-24: 2 mL via INTRAVENOUS

## 2024-10-25 ENCOUNTER — Ambulatory Visit: Payer: Self-pay

## 2024-11-09 DIAGNOSIS — I480 Paroxysmal atrial fibrillation: Secondary | ICD-10-CM
